# Patient Record
Sex: Male | Born: 1990 | Race: Black or African American | Hispanic: No | Marital: Single
Health system: Southern US, Community
[De-identification: ages and names within clinical notes are randomized; demographics above are authoritative.]

---
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
## 2013-02-20 IMAGING — CR DG ELBOW 2V*L*
1 series · 2 of 2 positions shown · non-contrast
Comparison: Left elbow radiographs performed earlier today at [DATE]
a.m.

CLINICAL DATA: Status post attempted reduction of left elbow
dislocation.

LEFT ELBOW - 2 VIEW

[Series 1: AP · left · 2 of 2 slices shown]
[im 1/2]
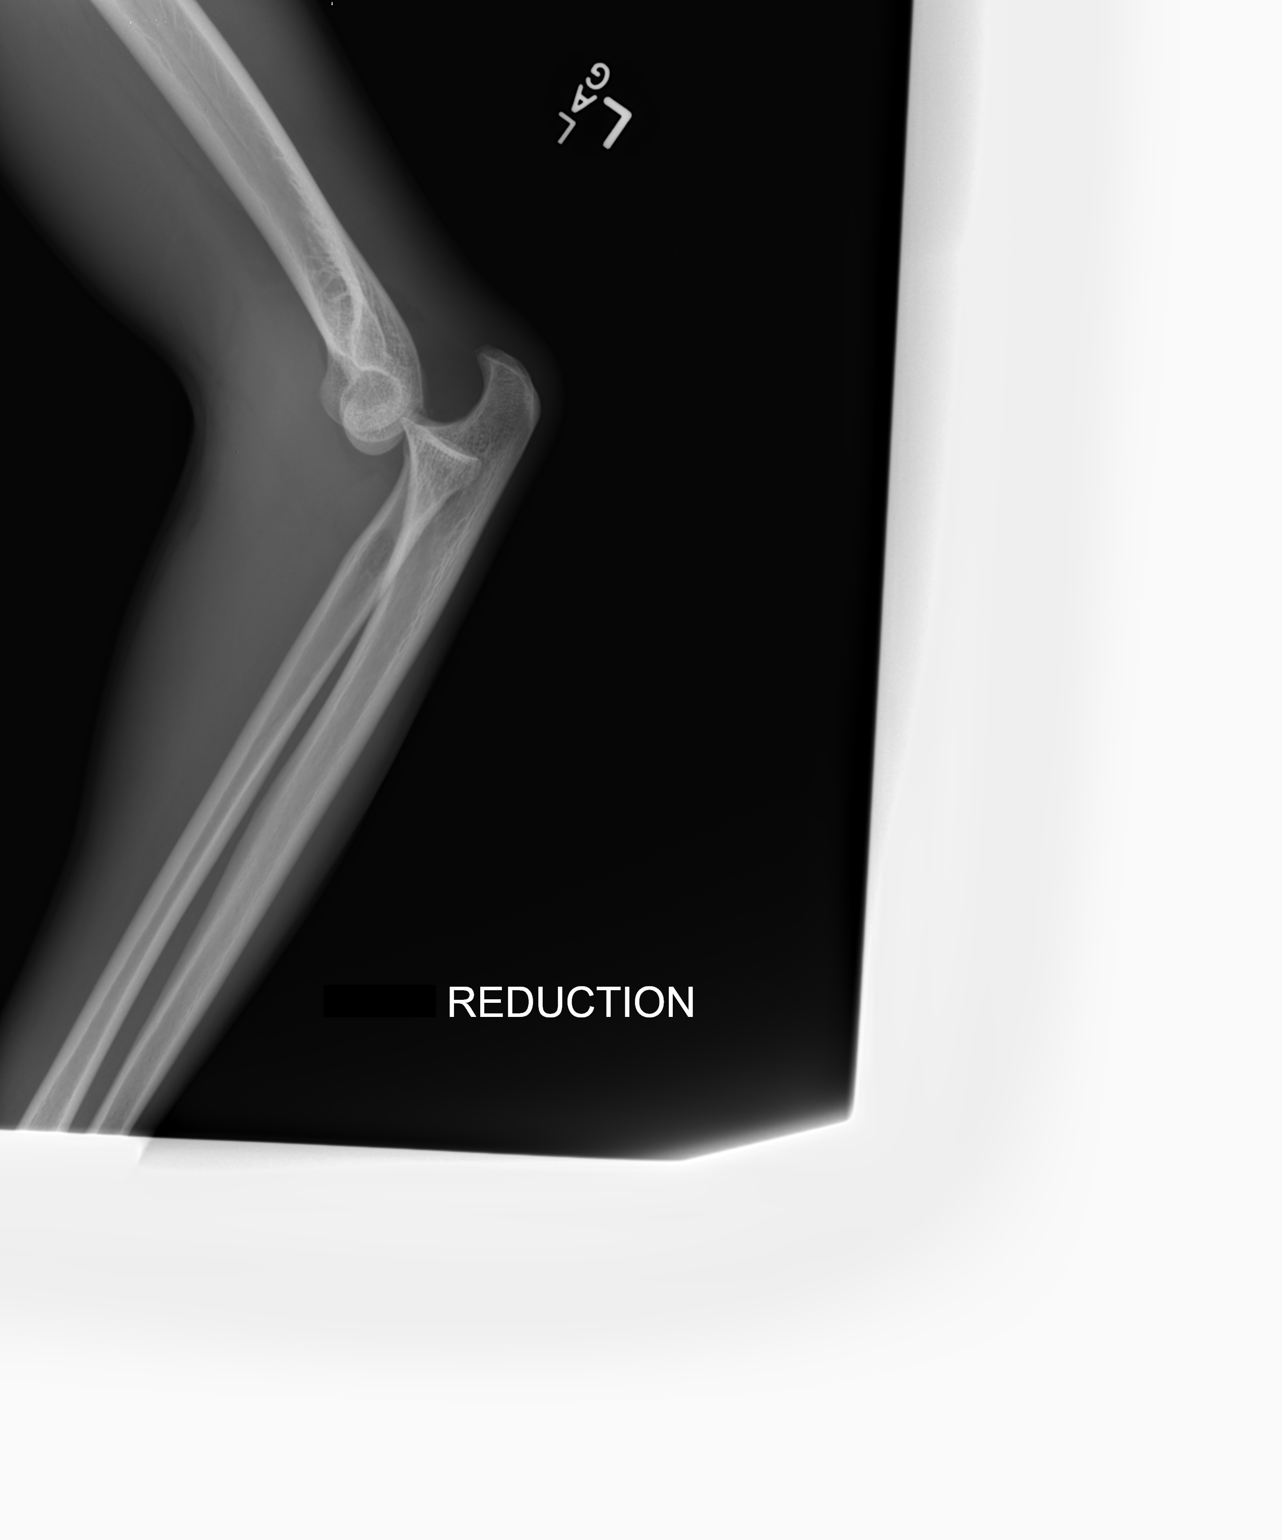
[im 2/2]
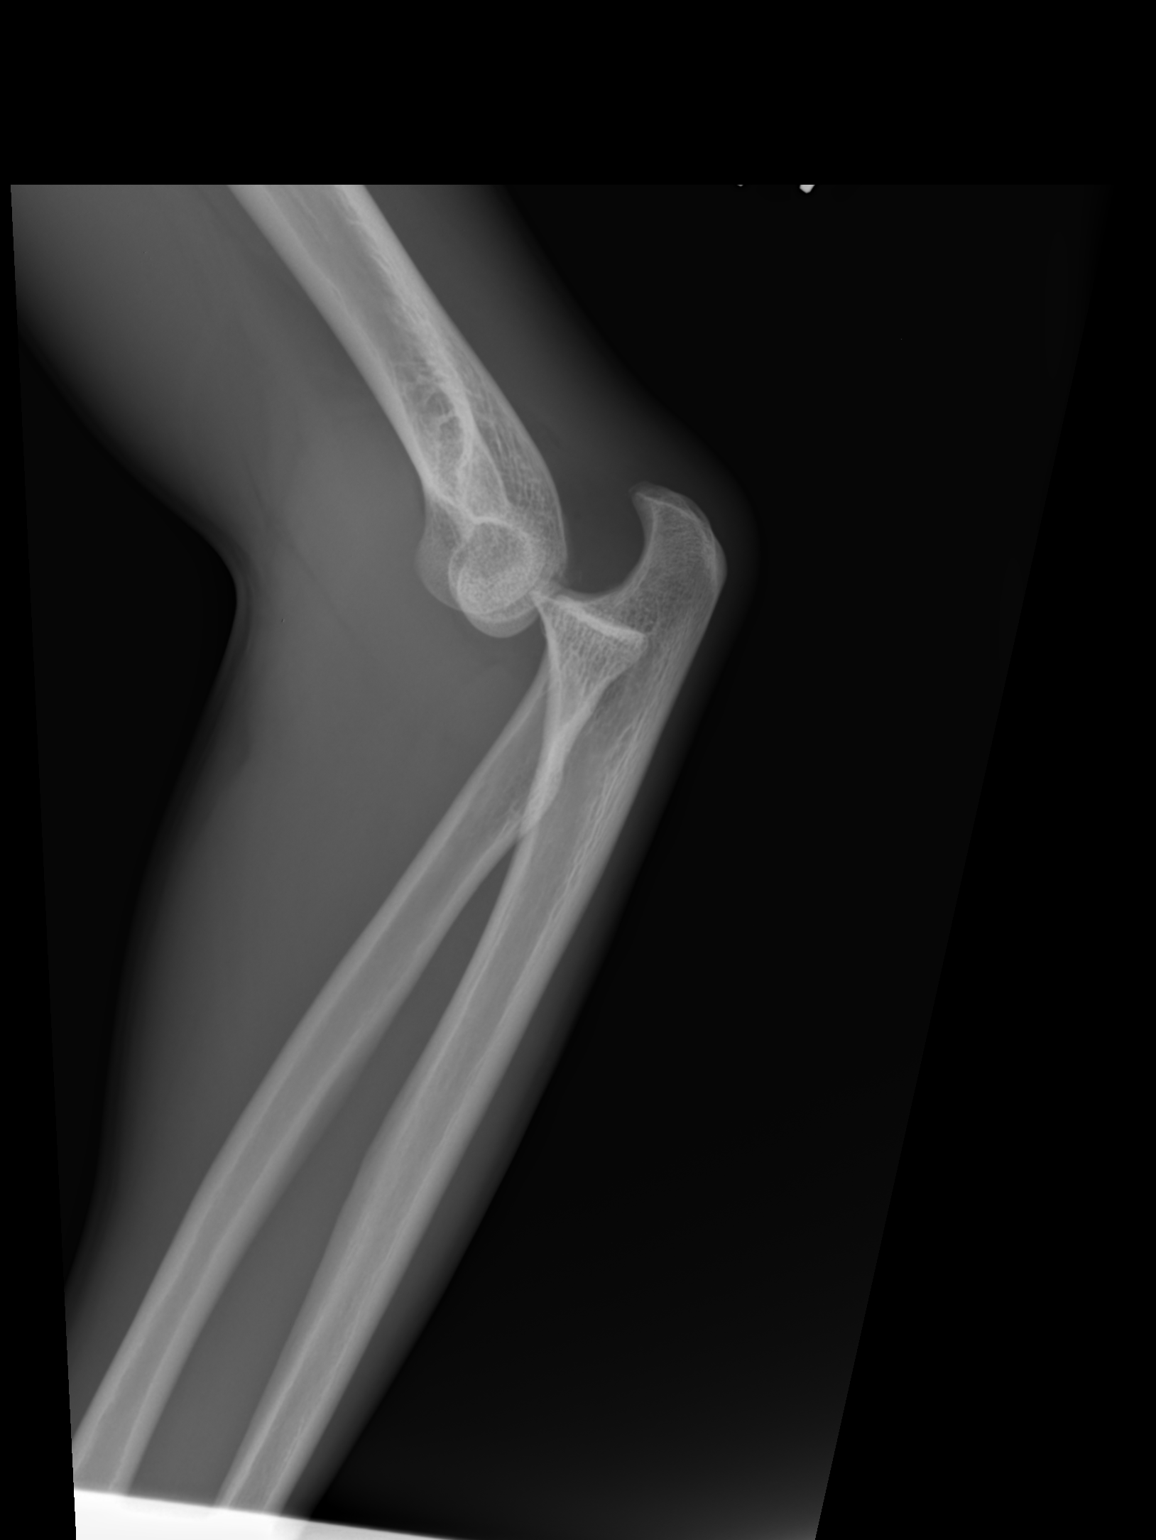

[2 of 2 positions shown; findings below may reference images not displayed]

FINDINGS: The previously noted dislocation of the left elbow joint
is now better characterized, with more typical positioning of the
dislocation.  The distal humerus is lodged against the coronoid
process.  No definite fractures are seen.

Mild surrounding soft tissue swelling is noted.
IMPRESSION: Left elbow joint dislocation is now better characterized; the
distal humerus is lodged against the coronoid process.  No definite
fractures seen.

## 2013-02-20 IMAGING — DX DG ELBOW 2V*L*
1 series · 2 of 2 positions shown · non-contrast
Comparison: Left elbow radiographs performed earlier today at [DATE]
a.m.

CLINICAL DATA: Status post reduction of left elbow dislocation.

LEFT ELBOW - 2 VIEW

[Series 1: AP · U · 2 of 2 slices shown]
[im 1/2]
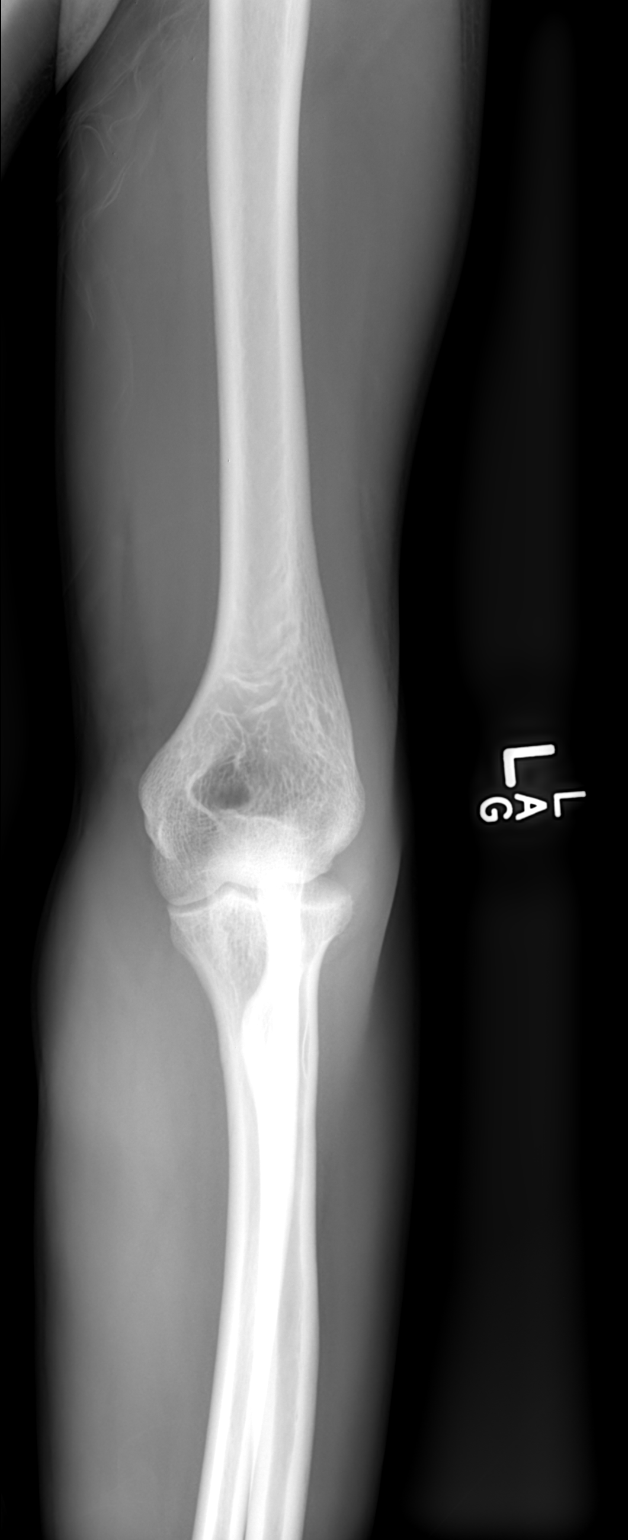
[im 2/2]
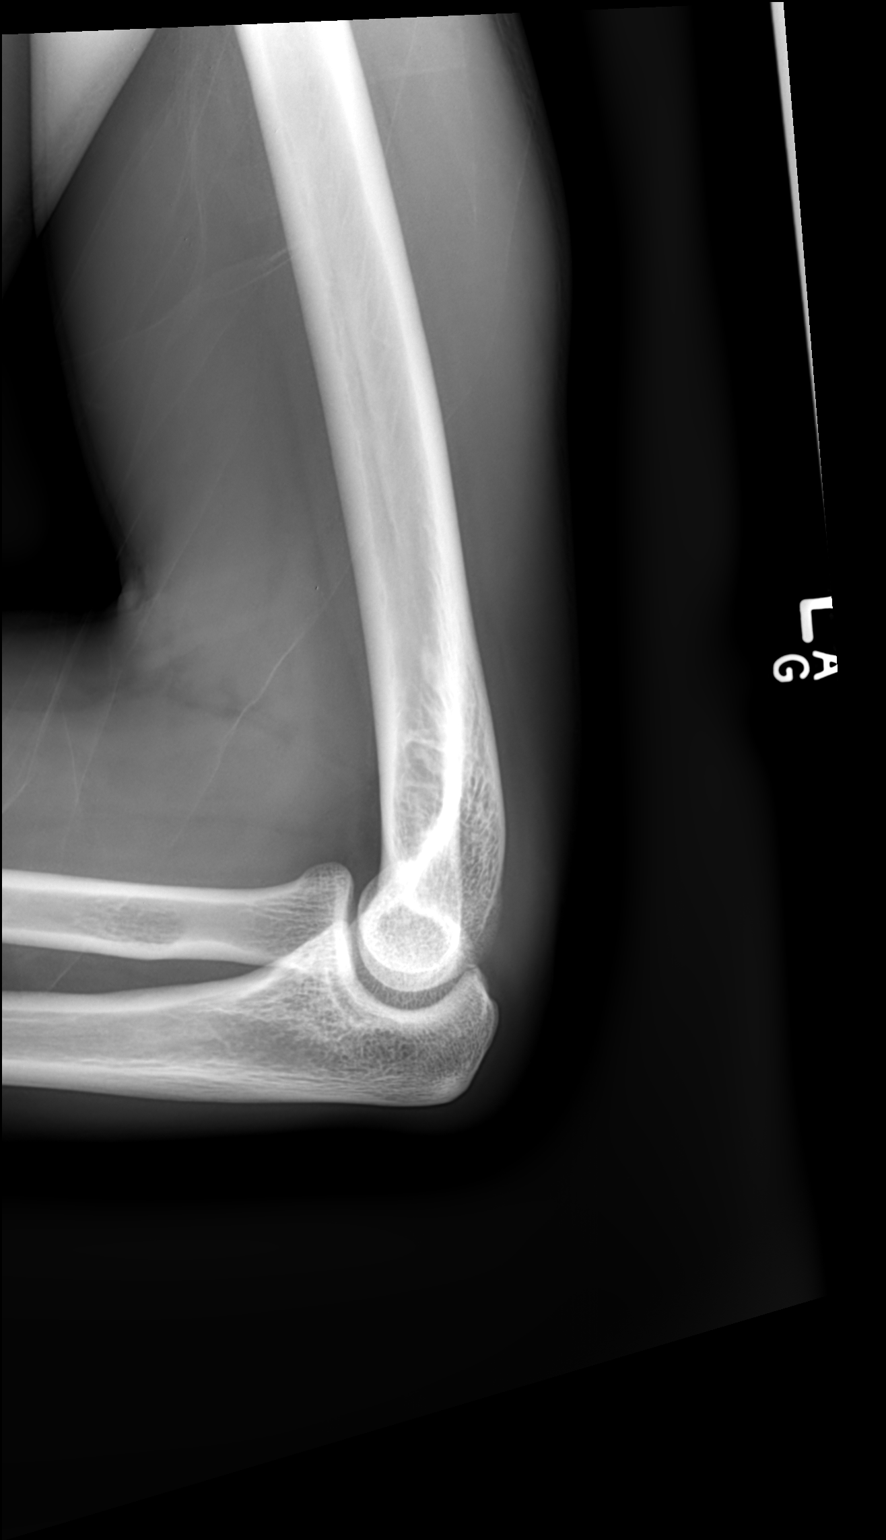

[2 of 2 positions shown; findings below may reference images not displayed]

FINDINGS: There has been successful reduction of the patient's left
elbow dislocation.  No definite fracture is seen.  No definite
elbow joint effusion is characterized, though the soft tissues are
difficult to fully assess.
IMPRESSION: Successful reduction of left elbow dislocation; no definite
fractures seen.

## 2013-02-20 IMAGING — CR DG ELBOW COMPLETE 3+V*L*
2 series · 2 of 2 positions shown · non-contrast
Comparison: None

CLINICAL DATA: 21-year-old male with elbow injury and pain.

LEFT ELBOW - COMPLETE 3+ VIEW

[x elbow ap left]
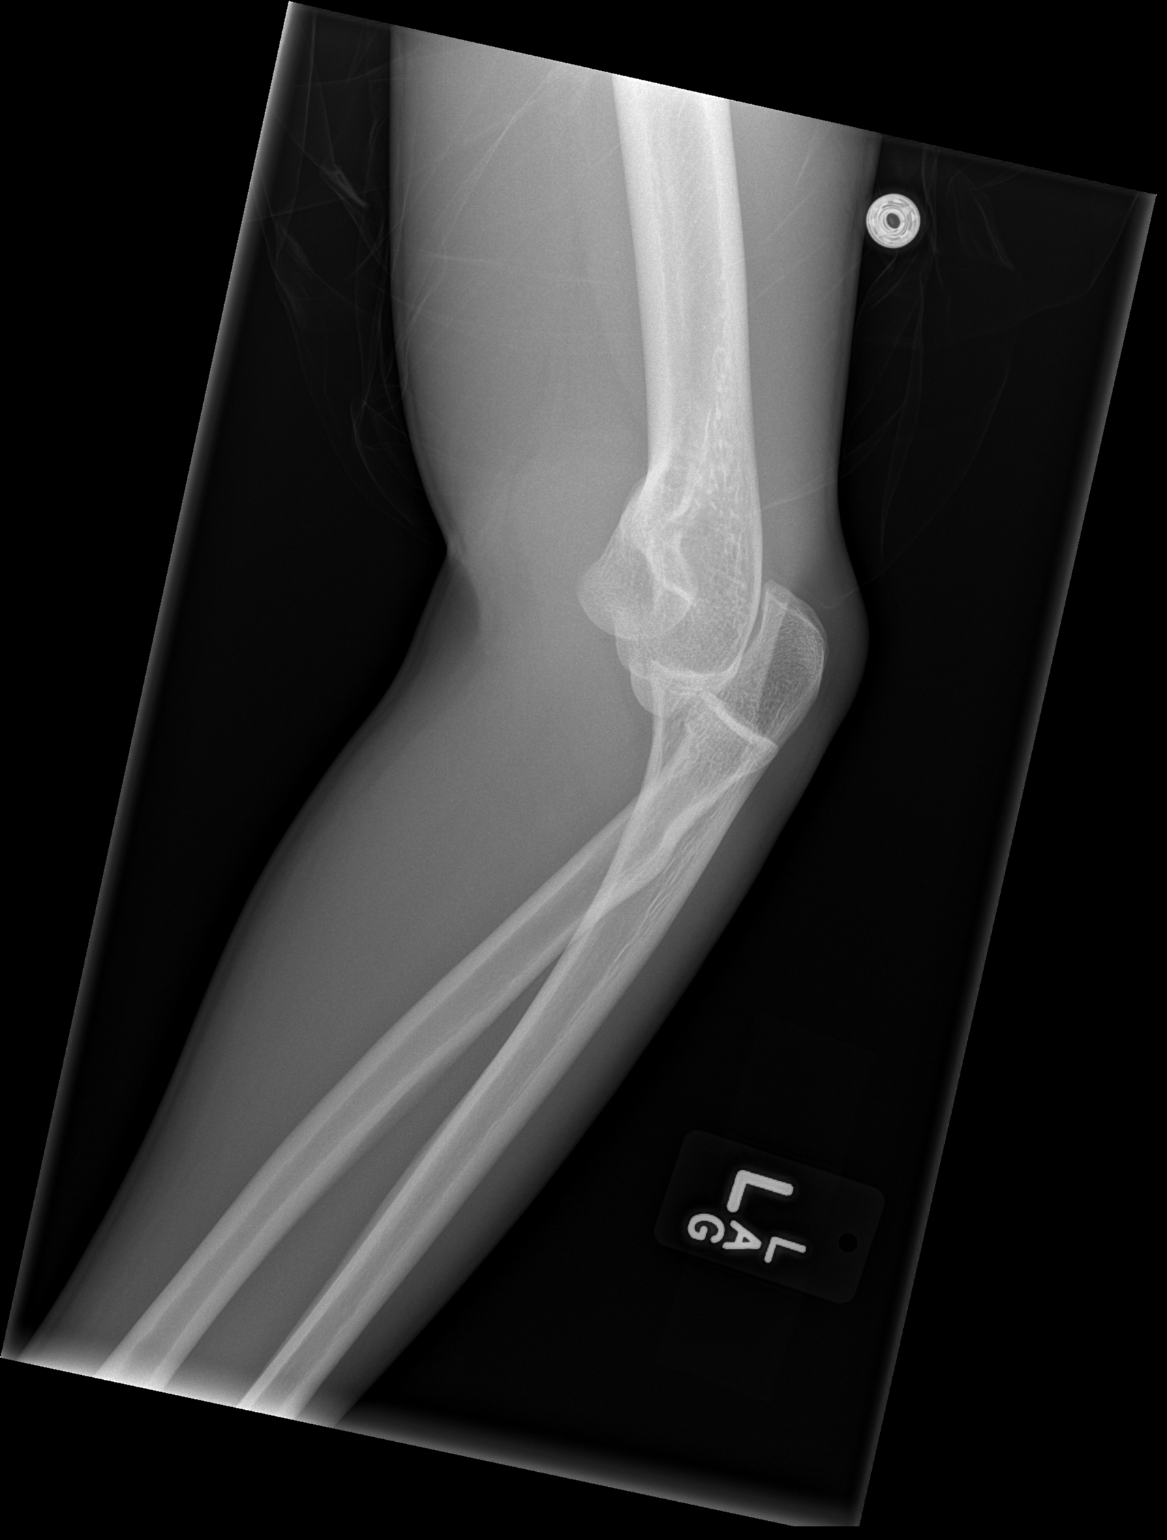

[x elbow lat left]
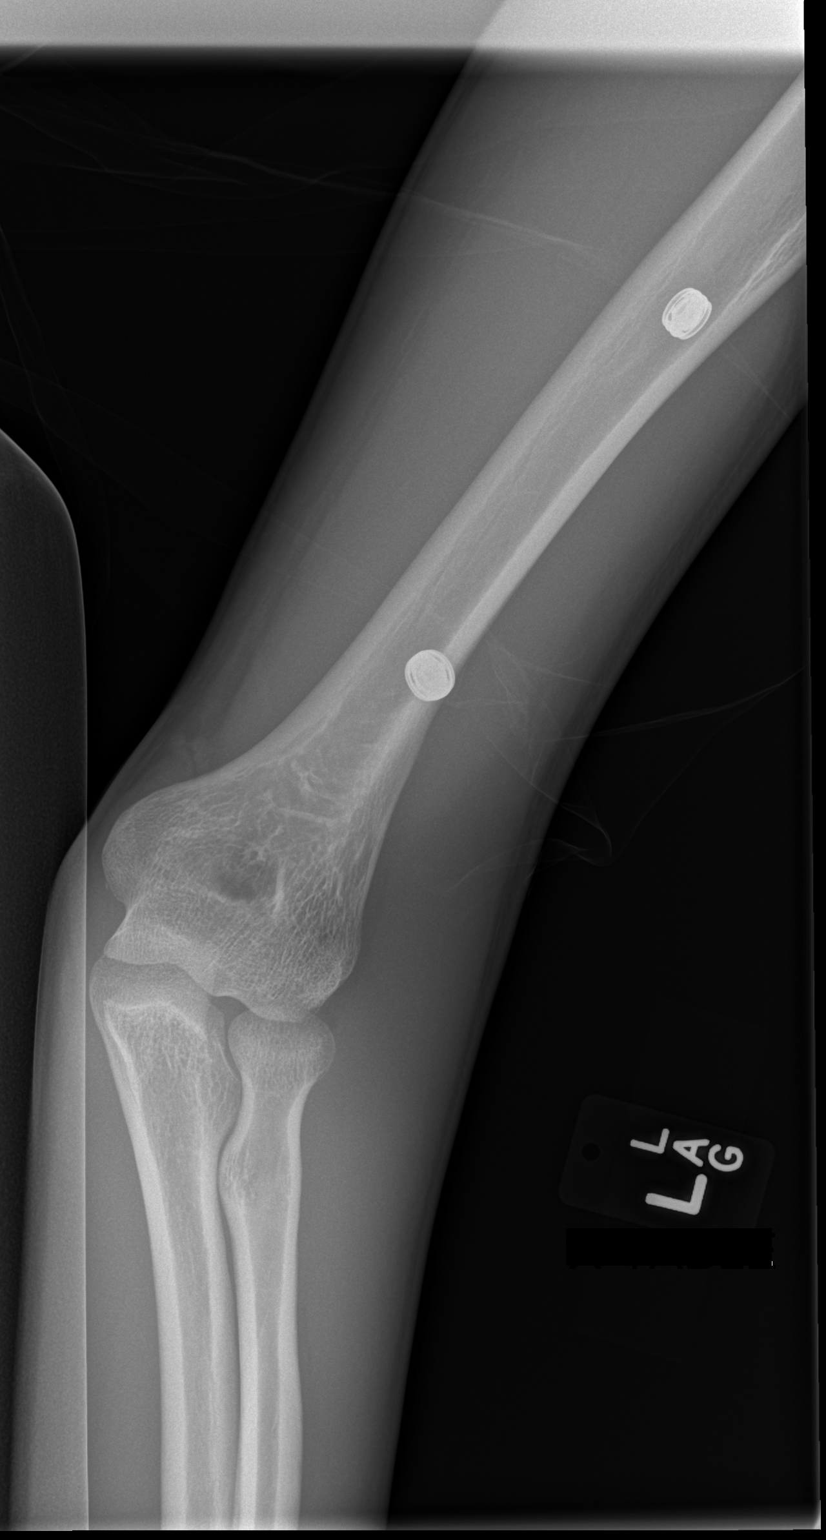

[2 of 2 positions shown; findings below may reference images not displayed]

FINDINGS: Posterior dislocation of the elbow is noted.
No discrete fracture is identified.
An elbow effusion is noted.
IMPRESSION: Posterior dislocation of the elbow without discrete fracture.

## 2014-01-23 ENCOUNTER — Ambulatory Visit
Admission: RE | Admit: 2014-01-23 | Discharge: 2014-01-23 | Disposition: A | Discharge status: Home / Self Care | Payer: No Typology Code available for payment source | Source: Ambulatory Visit | Attending: Physician Assistant | Admitting: Physician Assistant

## 2014-01-23 ENCOUNTER — Other Ambulatory Visit: Payer: Self-pay | Admitting: Physician Assistant

## 2014-01-23 DIAGNOSIS — T1490XA Injury, unspecified, initial encounter: Secondary | ICD-10-CM
